# Patient Record
Sex: Female | Born: 1970 | Race: White | Hispanic: No | Marital: Married | Smoking: Never smoker
Health system: Southern US, Community
[De-identification: ages and names within clinical notes are randomized; demographics above are authoritative.]

## PROBLEM LIST (undated history)

## (undated) DIAGNOSIS — I10 Essential (primary) hypertension: Secondary | ICD-10-CM

## (undated) DIAGNOSIS — F988 Other specified behavioral and emotional disorders with onset usually occurring in childhood and adolescence: Secondary | ICD-10-CM

## (undated) DIAGNOSIS — E669 Obesity, unspecified: Secondary | ICD-10-CM

## (undated) DIAGNOSIS — K219 Gastro-esophageal reflux disease without esophagitis: Secondary | ICD-10-CM

## (undated) HISTORY — DX: Gastro-esophageal reflux disease without esophagitis: K21.9

## (undated) HISTORY — DX: Essential (primary) hypertension: I10

## (undated) HISTORY — DX: Other specified behavioral and emotional disorders with onset usually occurring in childhood and adolescence: F98.8

## (undated) HISTORY — DX: Obesity, unspecified: E66.9

---

## 1997-02-13 HISTORY — PX: OTHER SURGICAL HISTORY: SHX169

## 1997-06-17 ENCOUNTER — Ambulatory Visit (HOSPITAL_COMMUNITY): Admission: RE | Admit: 1997-06-17 | Discharge: 1997-06-17 | Payer: Self-pay | Admitting: Surgery

## 1997-06-29 ENCOUNTER — Ambulatory Visit (HOSPITAL_COMMUNITY): Admission: RE | Admit: 1997-06-29 | Discharge: 1997-06-30 | Payer: Self-pay | Admitting: Surgery

## 1997-07-15 ENCOUNTER — Ambulatory Visit (HOSPITAL_COMMUNITY): Admission: RE | Admit: 1997-07-15 | Discharge: 1997-07-15 | Payer: Self-pay | Admitting: Surgery

## 1998-03-01 ENCOUNTER — Other Ambulatory Visit: Admission: RE | Admit: 1998-03-01 | Discharge: 1998-03-01 | Payer: Self-pay | Admitting: Obstetrics and Gynecology

## 1999-03-22 ENCOUNTER — Other Ambulatory Visit: Admission: RE | Admit: 1999-03-22 | Discharge: 1999-03-22 | Payer: Self-pay | Admitting: Obstetrics and Gynecology

## 1999-04-14 ENCOUNTER — Encounter: Payer: Self-pay | Admitting: Internal Medicine

## 1999-04-14 ENCOUNTER — Encounter: Admission: RE | Admit: 1999-04-14 | Discharge: 1999-04-14 | Payer: Self-pay | Admitting: Internal Medicine

## 2000-05-09 ENCOUNTER — Other Ambulatory Visit: Admission: RE | Admit: 2000-05-09 | Discharge: 2000-05-09 | Payer: Self-pay | Admitting: Obstetrics and Gynecology

## 2001-05-13 ENCOUNTER — Other Ambulatory Visit: Admission: RE | Admit: 2001-05-13 | Discharge: 2001-05-13 | Payer: Self-pay | Admitting: Obstetrics and Gynecology

## 2001-05-28 ENCOUNTER — Ambulatory Visit (HOSPITAL_COMMUNITY): Admission: RE | Admit: 2001-05-28 | Discharge: 2001-05-28 | Payer: Self-pay | Admitting: Gastroenterology

## 2002-06-02 ENCOUNTER — Other Ambulatory Visit: Admission: RE | Admit: 2002-06-02 | Discharge: 2002-06-02 | Payer: Self-pay | Admitting: Obstetrics and Gynecology

## 2002-07-21 ENCOUNTER — Encounter: Payer: Self-pay | Admitting: Internal Medicine

## 2002-07-21 ENCOUNTER — Encounter: Admission: RE | Admit: 2002-07-21 | Discharge: 2002-07-21 | Payer: Self-pay | Admitting: Internal Medicine

## 2002-07-23 ENCOUNTER — Encounter: Admission: RE | Admit: 2002-07-23 | Discharge: 2002-07-23 | Payer: Self-pay | Admitting: Internal Medicine

## 2002-07-23 ENCOUNTER — Encounter: Payer: Self-pay | Admitting: Internal Medicine

## 2002-11-03 ENCOUNTER — Other Ambulatory Visit: Admission: RE | Admit: 2002-11-03 | Discharge: 2002-11-03 | Payer: Self-pay | Admitting: Obstetrics and Gynecology

## 2003-05-13 ENCOUNTER — Inpatient Hospital Stay (HOSPITAL_COMMUNITY): Admission: AD | Admit: 2003-05-13 | Discharge: 2003-05-15 | Payer: Self-pay | Admitting: Obstetrics and Gynecology

## 2003-05-13 ENCOUNTER — Observation Stay (HOSPITAL_COMMUNITY): Admission: AD | Admit: 2003-05-13 | Discharge: 2003-05-13 | Payer: Self-pay | Admitting: Obstetrics and Gynecology

## 2003-06-23 ENCOUNTER — Other Ambulatory Visit: Admission: RE | Admit: 2003-06-23 | Discharge: 2003-06-23 | Payer: Self-pay | Admitting: Obstetrics and Gynecology

## 2004-08-06 ENCOUNTER — Emergency Department (HOSPITAL_COMMUNITY): Admission: EM | Admit: 2004-08-06 | Discharge: 2004-08-06 | Payer: Self-pay | Admitting: Emergency Medicine

## 2005-02-28 ENCOUNTER — Encounter: Admission: RE | Admit: 2005-02-28 | Discharge: 2005-05-29 | Payer: Self-pay | Admitting: Obstetrics and Gynecology

## 2005-08-20 ENCOUNTER — Inpatient Hospital Stay (HOSPITAL_COMMUNITY): Admission: AD | Admit: 2005-08-20 | Discharge: 2005-08-20 | Payer: Self-pay | Admitting: Obstetrics and Gynecology

## 2005-08-25 ENCOUNTER — Ambulatory Visit: Payer: Self-pay | Admitting: Obstetrics & Gynecology

## 2005-08-29 ENCOUNTER — Ambulatory Visit: Payer: Self-pay | Admitting: Obstetrics and Gynecology

## 2005-09-01 ENCOUNTER — Ambulatory Visit: Payer: Self-pay | Admitting: Obstetrics & Gynecology

## 2005-09-05 ENCOUNTER — Ambulatory Visit: Payer: Self-pay | Admitting: Obstetrics and Gynecology

## 2005-09-08 ENCOUNTER — Ambulatory Visit: Payer: Self-pay | Admitting: Obstetrics & Gynecology

## 2005-09-12 ENCOUNTER — Ambulatory Visit: Payer: Self-pay | Admitting: Obstetrics and Gynecology

## 2005-09-14 ENCOUNTER — Inpatient Hospital Stay (HOSPITAL_COMMUNITY): Admission: AD | Admit: 2005-09-14 | Discharge: 2005-09-14 | Payer: Self-pay | Admitting: Obstetrics and Gynecology

## 2005-09-19 ENCOUNTER — Ambulatory Visit: Payer: Self-pay | Admitting: Obstetrics and Gynecology

## 2005-09-22 ENCOUNTER — Ambulatory Visit: Payer: Self-pay | Admitting: Obstetrics & Gynecology

## 2005-09-25 ENCOUNTER — Inpatient Hospital Stay (HOSPITAL_COMMUNITY): Admission: AD | Admit: 2005-09-25 | Discharge: 2005-09-29 | Payer: Self-pay | Admitting: Obstetrics & Gynecology

## 2006-01-14 IMAGING — CR DG LUMBAR SPINE COMPLETE 4+V
5 series · 5 of 5 positions shown · non-contrast
Comparison: none

CLINICAL DATA: Low back pain.
LUMBAR SPINE - 4 VIEW:
There is marked narrowing of the L4-5 disc with anterior osteophyte formation.   Moderate narrowing at L3-4 is present.   There is no vertebral body height loss to suggest compression fracture.   No pars defects are seen.   Degenerative changes are also noted in the lower thoracic discs.  Little, if any, degenerative SI joint changes are noted.

[t l-spine a.p. *]
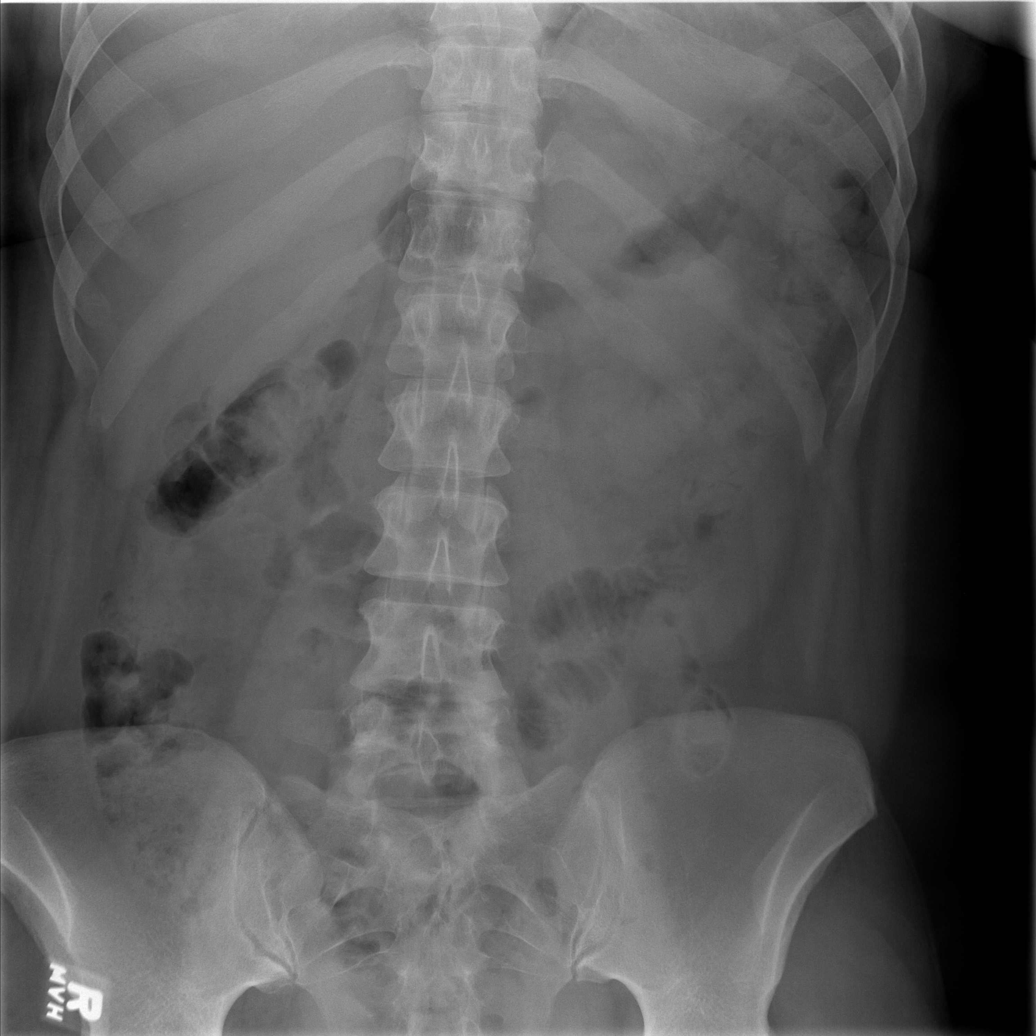

[t l-spine oblique exposure *]
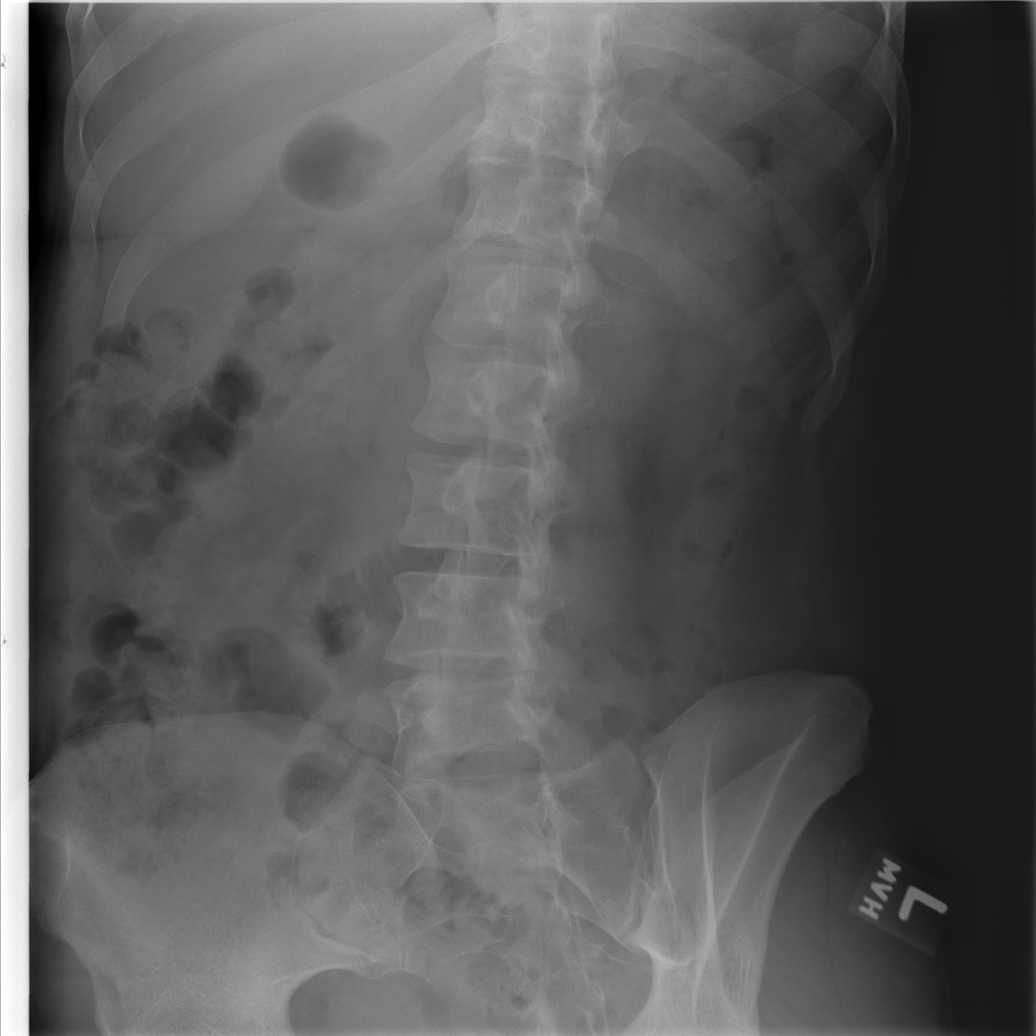

[t l-spine oblique exposure]
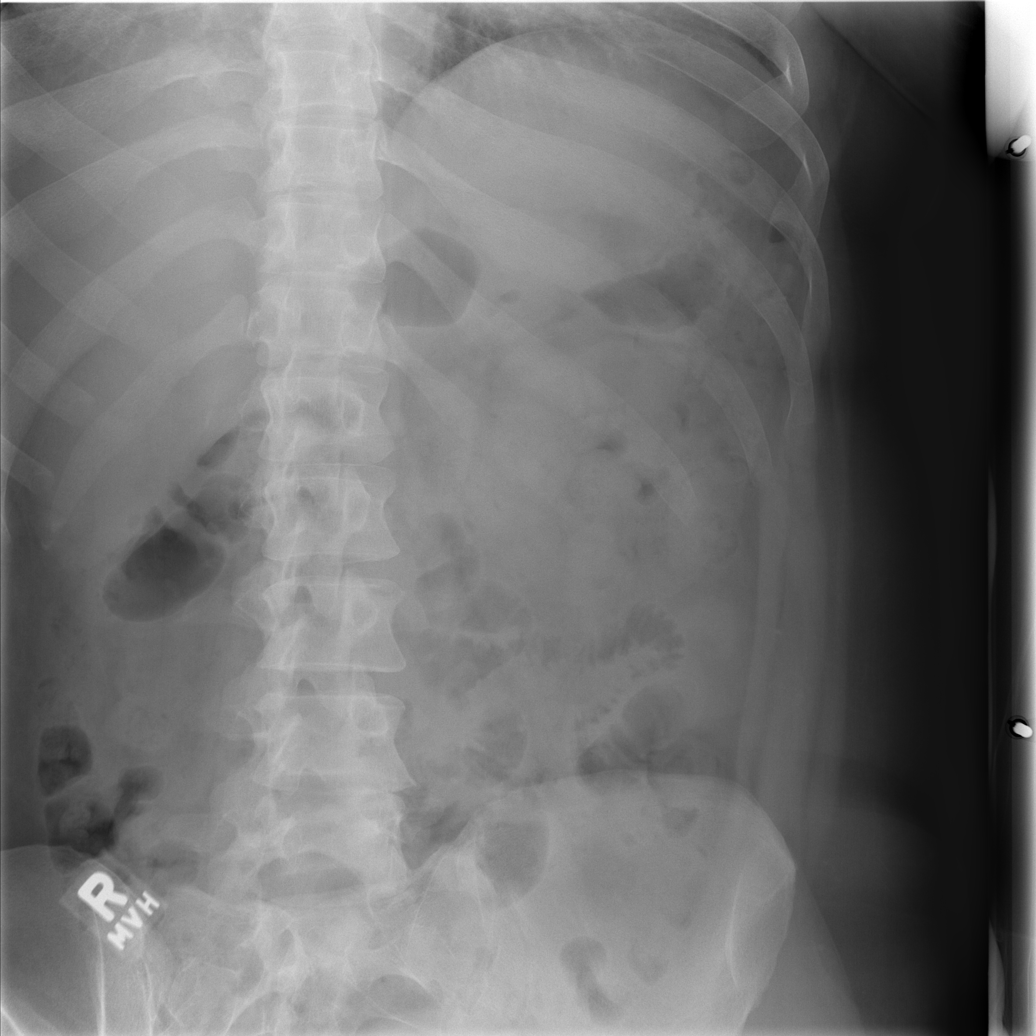

[t l-spine l5-s1 spot * (1 of 2)]
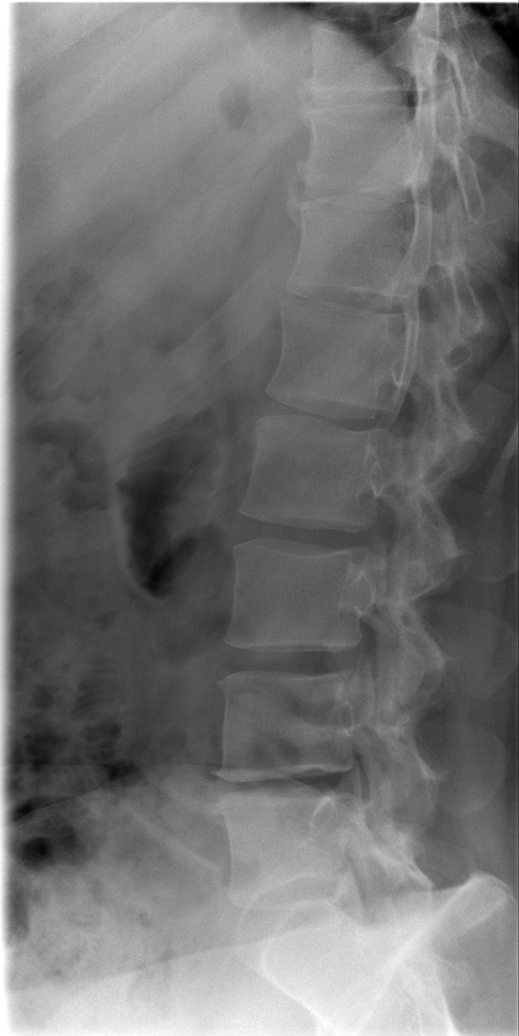

[t l-spine l5-s1 spot * (2 of 2)]
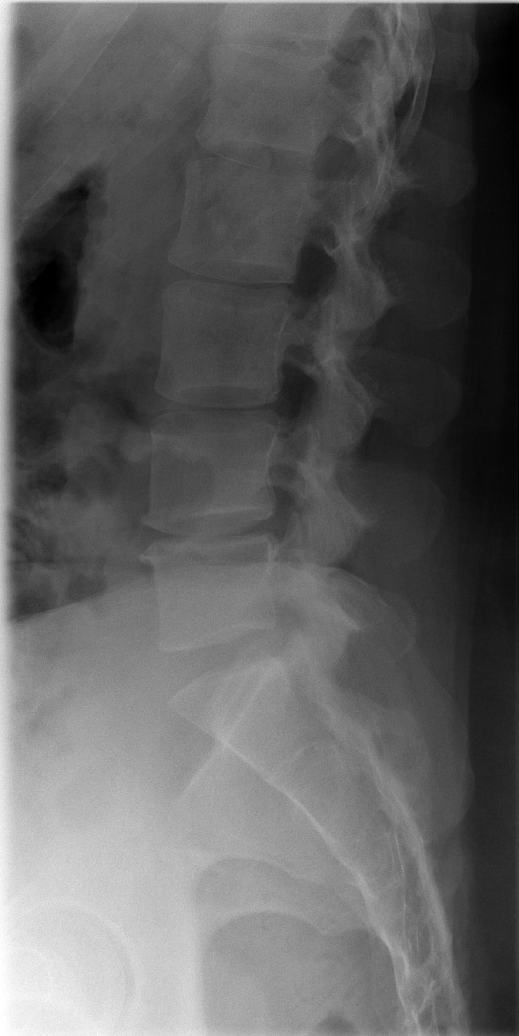

[5 of 5 positions shown; findings below may reference images not displayed]

IMPRESSION: Degenerative changes are most significant at the L4-5 disc.

## 2008-04-29 LAB — HM PAP SMEAR: HM Pap smear: 0

## 2008-11-25 LAB — HM MAMMOGRAPHY: HM Mammogram: 0

## 2012-04-29 DIAGNOSIS — Q752 Hypertelorism: Secondary | ICD-10-CM | POA: Insufficient documentation

## 2012-05-09 ENCOUNTER — Ambulatory Visit (INDEPENDENT_AMBULATORY_CARE_PROVIDER_SITE_OTHER): Payer: 59 | Admitting: Family Medicine

## 2012-05-09 ENCOUNTER — Encounter: Payer: Self-pay | Admitting: Family Medicine

## 2012-05-09 VITALS — BP 136/83 | HR 73 | Wt 259.0 lb

## 2012-05-09 DIAGNOSIS — M549 Dorsalgia, unspecified: Secondary | ICD-10-CM

## 2012-05-09 DIAGNOSIS — J309 Allergic rhinitis, unspecified: Secondary | ICD-10-CM

## 2012-05-09 DIAGNOSIS — F988 Other specified behavioral and emotional disorders with onset usually occurring in childhood and adolescence: Secondary | ICD-10-CM

## 2012-05-09 MED ORDER — FLUOXETINE HCL 40 MG PO CAPS: 40.0000 mg | ORAL_CAPSULE | Freq: Every day | ORAL | Status: DC

## 2012-05-09 MED ORDER — OLOPATADINE HCL 0.2 % OP SOLN: 1.0000 [drp] | Freq: Every day | OPHTHALMIC | Status: DC

## 2012-05-09 MED ORDER — METHOCARBAMOL 750 MG PO TABS: 750.0000 mg | ORAL_TABLET | Freq: Three times a day (TID) | ORAL | Status: DC

## 2012-05-09 MED ORDER — LISDEXAMFETAMINE DIMESYLATE 70 MG PO CAPS: 70.0000 mg | ORAL_CAPSULE | ORAL | Status: DC

## 2012-05-09 MED ORDER — AZELASTINE-FLUTICASONE 137-50 MCG/ACT NA SUSP: 1.0000 | Freq: Two times a day (BID) | NASAL | Status: DC

## 2012-05-09 MED ORDER — MONTELUKAST SODIUM 10 MG PO TABS: 10.0000 mg | ORAL_TABLET | Freq: Every day | ORAL | Status: DC

## 2012-05-09 MED ORDER — CETIRIZINE-PSEUDOEPHEDRINE ER 5-120 MG PO TB12: 1.0000 | ORAL_TABLET | Freq: Two times a day (BID) | ORAL | Status: DC

## 2012-05-09 NOTE — Progress Notes (Signed)
Subjective:     Patient ID: Martha Graham, female   DOB: 1970/08/08, 42 y.o.   MRN: [id number]  HPI Azaiah is here today to get her Vyvanse refilled.  She is doing well on her current dosage.  She also wants to discuss her back pain.  She has been taking a muscle relaxer and has been applying Voltaren Gel.  She is also getting ready for allergy season.  She would like to have some mediations for her symptoms.    Review of Systems  HENT: Positive for rhinorrhea, sneezing and postnasal drip.   Musculoskeletal: Positive for back pain.       Objective:   Physical Exam  Constitutional: She appears well-nourished. No distress.  Neck: No thyromegaly present.  Cardiovascular: Normal rate, regular rhythm and normal heart sounds.   No murmur heard. Pulmonary/Chest: Effort normal and breath sounds normal.  Musculoskeletal: She exhibits no edema.  Psychiatric: She has a normal mood and affect. Her behavior is normal. Judgment and thought content normal.       Assessment:     ADD Obesity Allergic Rhinitis  Back Pain     Plan:     Refilled her Vyvanse  She is planning to attend a seminar on gastric bypass  She was given prescriptions for several medications to control allergy symptoms   Refilled her Robaxin.

## 2012-05-09 NOTE — Patient Instructions (Addendum)
Allergies, Generic  Allergies may happen from anything your body is sensitive to. This may be food, medicines, pollens, chemicals, and nearly anything around you in everyday life that produces allergens. An allergen is anything that causes an allergy producing substance. Heredity is often a factor in causing these problems. This means you may have some of the same allergies as your parents.  Food allergies happen in all age groups. Food allergies are some of the most severe and life threatening. Some common food allergies are cow's milk, seafood, eggs, nuts, wheat, and soybeans.  SYMPTOMS    Swelling around the mouth.   An itchy red rash or hives.   Vomiting or diarrhea.   Difficulty breathing.  SEVERE ALLERGIC REACTIONS ARE LIFE-THREATENING.  This reaction is called anaphylaxis. It can cause the mouth and throat to swell and cause difficulty with breathing and swallowing. In severe reactions only a trace amount of food (for example, peanut oil in a salad) may cause death within seconds.  Seasonal allergies occur in all age groups. These are seasonal because they usually occur during the same season every year. They may be a reaction to molds, grass pollens, or tree pollens. Other causes of problems are house dust mite allergens, pet dander, and mold spores. The symptoms often consist of nasal congestion, a runny itchy nose associated with sneezing, and tearing itchy eyes. There is often an associated itching of the mouth and ears. The problems happen when you come in contact with pollens and other allergens. Allergens are the particles in the air that the body reacts to with an allergic reaction. This causes you to release allergic antibodies. Through a chain of events, these eventually cause you to release histamine into the blood stream. Although it is meant to be protective to the body, it is this release that causes your discomfort. This is why you were given anti-histamines to feel better. If you are  unable to pinpoint the offending allergen, it may be determined by skin or blood testing. Allergies cannot be cured but can be controlled with medicine.  Hay fever is a collection of all or some of the seasonal allergy problems. It may often be treated with simple over-the-counter medicine such as diphenhydramine. Take medicine as directed. Do not drink alcohol or drive while taking this medicine. Check with your caregiver or package insert for child dosages.  If these medicines are not effective, there are many new medicines your caregiver can prescribe. Stronger medicine such as nasal spray, eye drops, and corticosteroids may be used if the first things you try do not work well. Other treatments such as immunotherapy or desensitizing injections can be used if all else fails. Follow up with your caregiver if problems continue. These seasonal allergies are usually not life threatening. They are generally more of a nuisance that can often be handled using medicine.  HOME CARE INSTRUCTIONS    If unsure what causes a reaction, keep a diary of foods eaten and symptoms that follow. Avoid foods that cause reactions.   If hives or rash are present:   Take medicine as directed.   You may use an over-the-counter antihistamine (diphenhydramine) for hives and itching as needed.   Apply cold compresses (cloths) to the skin or take baths in cool water. Avoid hot baths or showers. Heat will make a rash and itching worse.   If you are severely allergic:   Following a treatment for a severe reaction, hospitalization is often required for closer follow-up.     Wear a medic-alert bracelet or necklace stating the allergy.   You and your family must learn how to give adrenaline or use an anaphylaxis kit.   If you have had a severe reaction, always carry your anaphylaxis kit or EpiPen with you. Use this medicine as directed by your caregiver if a severe reaction is occurring. Failure to do so could have a fatal outcome.  SEEK  MEDICAL CARE IF:   You suspect a food allergy. Symptoms generally happen within 30 minutes of eating a food.   Your symptoms have not gone away within 2 days or are getting worse.   You develop new symptoms.   You want to retest yourself or your child with a food or drink you think causes an allergic reaction. Never do this if an anaphylactic reaction to that food or drink has happened before. Only do this under the care of a caregiver.  SEEK IMMEDIATE MEDICAL CARE IF:    You have difficulty breathing, are wheezing, or have a tight feeling in your chest or throat.   You have a swollen mouth, or you have hives, swelling, or itching all over your body.   You have had a severe reaction that has responded to your anaphylaxis kit or an EpiPen. These reactions may return when the medicine has worn off. These reactions should be considered life threatening.  MAKE SURE YOU:    Understand these instructions.   Will watch your condition.   Will get help right away if you are not doing well or get worse.  Document Released: 04/25/2002 Document Revised: 04/24/2011 Document Reviewed: 09/30/2007  [employer] Patient Information 2013 [employer].

## 2012-05-12 ENCOUNTER — Encounter: Payer: Self-pay | Admitting: Family Medicine

## 2012-05-12 DIAGNOSIS — J309 Allergic rhinitis, unspecified: Secondary | ICD-10-CM | POA: Insufficient documentation

## 2012-05-12 DIAGNOSIS — F988 Other specified behavioral and emotional disorders with onset usually occurring in childhood and adolescence: Secondary | ICD-10-CM | POA: Insufficient documentation

## 2012-05-12 DIAGNOSIS — M549 Dorsalgia, unspecified: Secondary | ICD-10-CM | POA: Insufficient documentation

## 2012-06-03 ENCOUNTER — Encounter: Payer: Self-pay | Admitting: *Deleted

## 2012-07-31 ENCOUNTER — Ambulatory Visit (INDEPENDENT_AMBULATORY_CARE_PROVIDER_SITE_OTHER): Payer: 59 | Admitting: Family Medicine

## 2012-07-31 ENCOUNTER — Encounter: Payer: Self-pay | Admitting: Family Medicine

## 2012-07-31 VITALS — BP 129/85 | HR 74 | Ht 68.0 in | Wt 255.0 lb

## 2012-07-31 DIAGNOSIS — M542 Cervicalgia: Secondary | ICD-10-CM | POA: Insufficient documentation

## 2012-07-31 DIAGNOSIS — F988 Other specified behavioral and emotional disorders with onset usually occurring in childhood and adolescence: Secondary | ICD-10-CM

## 2012-07-31 DIAGNOSIS — H9203 Otalgia, bilateral: Secondary | ICD-10-CM

## 2012-07-31 DIAGNOSIS — J069 Acute upper respiratory infection, unspecified: Secondary | ICD-10-CM | POA: Insufficient documentation

## 2012-07-31 DIAGNOSIS — H9209 Otalgia, unspecified ear: Secondary | ICD-10-CM | POA: Insufficient documentation

## 2012-07-31 MED ORDER — LISDEXAMFETAMINE DIMESYLATE 70 MG PO CAPS: 70.0000 mg | ORAL_CAPSULE | ORAL | Status: DC

## 2012-07-31 MED ORDER — METHYLPREDNISOLONE SODIUM SUCC 125 MG IJ SOLR
125.0000 mg | Freq: Once | INTRAMUSCULAR | Status: AC
  Administered 2012-07-31: 125 mg via INTRAMUSCULAR

## 2012-07-31 MED ORDER — KETOROLAC TROMETHAMINE 60 MG/2ML IM SOLN
60.0000 mg | Freq: Once | INTRAMUSCULAR | Status: AC
  Administered 2012-07-31: 60 mg via INTRAMUSCULAR

## 2012-07-31 NOTE — Patient Instructions (Addendum)
1)  Neck Pain - You received injections of Solu-Medrol and Toradol at today's visit.  You might consider getting a chiropractic adjustment.    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       Temporomandibular Problems  Temporomandibular joint (TMJ) dysfunction means there are problems with the joint between your jaw and your skull. This is a joint lined by cartilage like other joints in your body but also has a small disc in the joint which keeps the bones from rubbing on each other. These joints are like other joints and can get inflamed (sore) from arthritis and other problems. When this joint gets sore, it can cause headaches and pain in the jaw and the face. CAUSES  Usually the arthritic types of problems are caused by soreness in the joint. Soreness in the joint can also be caused by overuse. This may come from grinding your teeth. It may also come from mis-alignment in the joint. DIAGNOSIS Diagnosis of this condition can often be made by history and exam. Sometimes your caregiver may need X-rays or an MRI scan to determine the exact cause. It may be necessary to see your dentist to determine if your teeth and jaws are lined up correctly. TREATMENT  Most of the time this problem is not serious; however, sometimes it can persist (become chronic). When this happens medications that will cut down on inflammation (soreness) help. Sometimes a shot of cortisone into the joint will be helpful. If your teeth are not aligned it may help for your dentist to make a splint for your mouth that can help this problem. If no physical problems can be found, the problem may come from tension. If tension is found to be the cause, biofeedback or relaxation techniques may be helpful. HOME CARE INSTRUCTIONS   Later in the day, applications of ice packs may be helpful. Ice can be used in a plastic bag with a towel around it to prevent frostbite to skin. This may be used about every 2 hours for 20 to 30  minutes, as needed while awake, or as directed by your caregiver.  Only take over-the-counter or prescription medicines for pain, discomfort, or fever as directed by your caregiver.  If physical therapy was prescribed, follow your caregiver's directions.  Wear mouth appliances as directed if they were given. Document Released: 10/25/2000 Document Revised: 04/24/2011 Document Reviewed: 02/02/2008 [facility] Patient Information 2014 [place], [place].

## 2012-07-31 NOTE — Assessment & Plan Note (Signed)
Refilled her Vyvanse 

## 2012-07-31 NOTE — Progress Notes (Signed)
  Subjective:    Patient ID: Martha Graham, female    DOB: 06-08-1970, 42 y.o.   MRN: [id number]  HPI  Martha Graham is here today to discuss a few issues:   1)  ADD:  She is here today to get a refill on her Vyvanse. She is doing fine on her current dosage.  She is eating and sleeping fine.   2) Neck/Head Pain:  She has been having some pain in her neck that radiates up to her head on the right side.  She has tried taking OTC pain analgesics which have not given her much relief.   3)  Nasal Congestion:  She has had nasal congestion for over a week. She went to the [facility] on Saturday and was told that she had an infected right ear.  She was put on Amoxicillin for 10 days.  She really does not feel that the antibiotic has helped her very much.    Review of Systems  HENT: Positive for ear pain, congestion and rhinorrhea.   Musculoskeletal: Positive for arthralgias (Neck Pain).       Neck Pain   Psychiatric/Behavioral: Negative for sleep disturbance. The patient is not nervous/anxious.      Past Medical History  Diagnosis Date  . Hypertension   . Obesity   . Attention deficit disorder without mention of hyperactivity   . GERD (gastroesophageal reflux disease)      Family History  Problem Relation Age of Onset  . Diabetes type II Maternal Grandfather   . CVA Maternal Grandfather   . Alzheimer's disease Paternal Grandmother   . Breast cancer Paternal Grandmother    History   Social History Narrative   Cardiovascular Disease:  None   CVA:  Maternal Grandfather   Type II Diabetes:  Maternal Grandfather   Hypertension:  None   Hyperlipidemia:  None   Alzheimer's Disease:  Paternal Grandmother   Breast Cancer: Paternal Grandmother   Ovarian Cancer:  None   Colon Cancer:  None   Depression:  None   Asthma:  None   Thyroid Disease:  None         Objective:   Physical Exam  Constitutional: She appears well-nourished. No distress.  HENT:  Head: Normocephalic.   Mouth/Throat: No oropharyngeal exudate.  Eyes: Conjunctivae are normal. Right eye exhibits no discharge. Left eye exhibits no discharge. No scleral icterus.  Neck: Neck supple. No thyromegaly present.  Cardiovascular: Normal rate, regular rhythm and normal heart sounds.  Exam reveals no gallop and no friction rub.   No murmur heard. Pulmonary/Chest: Effort normal and breath sounds normal. She has no wheezes. She exhibits no tenderness.  Abdominal: There is no tenderness.  Musculoskeletal: Normal range of motion. She exhibits tenderness (Her neck muscles are very tight ). She exhibits no edema.  Lymphadenopathy:    She has no cervical adenopathy.  Neurological: She is alert.  Skin: Skin is warm and dry. No rash noted.  Psychiatric: She has a normal mood and affect. Her behavior is normal. Judgment and thought content normal.          Assessment & Plan:

## 2012-07-31 NOTE — Assessment & Plan Note (Signed)
She has been taking Amoxil since Saturday.  Her right ear looks fine now.  She will finish the full course of the antibiotic.  We discussed how her ear pain may be related to TMJ. She is going to apply some Voltaren Gel around the right ear and will take the Robaxin she has at home.

## 2012-07-31 NOTE — Assessment & Plan Note (Signed)
She was given injections of Solu-Medrol and Toradol.  We also discussed her seeing a chiropractor for an adjustment.

## 2012-08-09 ENCOUNTER — Ambulatory Visit: Payer: 59 | Admitting: Family Medicine

## 2012-10-22 ENCOUNTER — Ambulatory Visit: Payer: 59 | Admitting: Family Medicine

## 2012-12-25 ENCOUNTER — Ambulatory Visit (INDEPENDENT_AMBULATORY_CARE_PROVIDER_SITE_OTHER): Payer: 59 | Admitting: Family Medicine

## 2012-12-25 ENCOUNTER — Encounter: Payer: Self-pay | Admitting: Family Medicine

## 2012-12-25 VITALS — BP 154/105 | HR 81 | Resp 16 | Ht 68.5 in | Wt 258.0 lb

## 2012-12-25 DIAGNOSIS — R41840 Attention and concentration deficit: Secondary | ICD-10-CM

## 2012-12-25 DIAGNOSIS — I10 Essential (primary) hypertension: Secondary | ICD-10-CM

## 2012-12-25 DIAGNOSIS — K219 Gastro-esophageal reflux disease without esophagitis: Secondary | ICD-10-CM

## 2012-12-25 DIAGNOSIS — F411 Generalized anxiety disorder: Secondary | ICD-10-CM

## 2012-12-25 DIAGNOSIS — J309 Allergic rhinitis, unspecified: Secondary | ICD-10-CM

## 2012-12-25 MED ORDER — TRIAMTERENE-HCTZ 37.5-25 MG PO TABS: 1.0000 | ORAL_TABLET | Freq: Every day | ORAL | Status: AC

## 2012-12-25 MED ORDER — PANTOPRAZOLE SODIUM 40 MG PO TBEC: 40.0000 mg | DELAYED_RELEASE_TABLET | Freq: Every day | ORAL | Status: AC

## 2012-12-25 MED ORDER — NEBIVOLOL HCL 10 MG PO TABS: 10.0000 mg | ORAL_TABLET | Freq: Every day | ORAL | Status: AC

## 2012-12-25 MED ORDER — LISDEXAMFETAMINE DIMESYLATE 70 MG PO CAPS: 70.0000 mg | ORAL_CAPSULE | Freq: Every day | ORAL | Status: DC

## 2012-12-25 MED ORDER — FLUOXETINE HCL 40 MG PO CAPS: 40.0000 mg | ORAL_CAPSULE | Freq: Every day | ORAL | Status: DC

## 2012-12-25 NOTE — Progress Notes (Signed)
Subjective:    Patient ID: Martha Graham, female    DOB: 1970/03/24, 42 y.o.   MRN: [id number]  HPI  Martha Graham is here today to discuss the conditions listed below:   1)  ADD - She needs a refill of her ADD medication (Vyvanse 70 mg).  She says that the medication continues to help her concentrate at work.  She feels that this dosage is appropriate and would like to continue on it.    2)  Obesity - She continues to struggle with her weight.  She has been thinking about bariatric surgery vs appetite suppressants.    3)  Hypertension - She was taking Bystolic (5 mg) 1/2 of 10 mg.  She has run out of it and has been taking only the Triamterene/HCTZ.  4)  GERD - She needs a refill on her pantoprazole (40 mg).    Review of Systems  Constitutional: Negative.   HENT: Negative.   Eyes: Negative.   Respiratory: Negative.   Cardiovascular: Negative.   Gastrointestinal: Negative.   Endocrine: Negative.   Genitourinary: Negative.   Musculoskeletal: Negative.   Skin: Negative.   Allergic/Immunologic: Negative.   Neurological: Negative.   Hematological: Negative.   Psychiatric/Behavioral: Negative.      Past Medical History  Diagnosis Date  . Hypertension   . Obesity   . Attention deficit disorder without mention of hyperactivity   . GERD (gastroesophageal reflux disease)      Past Surgical History  Procedure Laterality Date  . Cesarean section    . 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 Diabetes type II Maternal Grandfather   . CVA  Maternal Grandfather   . Alzheimer's disease Paternal Grandmother   . Breast cancer Paternal Grandmother      Current Outpatient Prescriptions on File Prior to Visit  Medication Sig Dispense Refill  . cetirizine-pseudoephedrine (ZYRTEC-D) 5-120 MG per tablet Take 1 tablet by mouth 2 (two) times daily.  60 tablet  11  . lisdexamfetamine (VYVANSE) 70 MG capsule Take 1 capsule (70 mg total) by mouth every morning.  90 capsule  0  . methocarbamol (ROBAXIN) 750 MG tablet Take 1 tablet (750 mg total) by mouth 3 (three) times daily.  90 tablet  3  . montelukast (SINGULAIR) 10 MG tablet Take 1 tablet (10 mg total) by mouth at bedtime.  30 tablet  11   No current facility-administered medications on file prior to visit.     No Known Allergies   Immunization History  Administered Date(s) Administered  . Tdap 01/13/2006      Objective:   Physical Exam  Vitals reviewed. Constitutional: She is oriented to person, place, and time. She appears well-developed and well-nourished.  Cardiovascular: Normal rate and regular rhythm.   Pulmonary/Chest: Effort normal and breath sounds normal.  Neurological: She is alert and oriented to person, place, and time.  Skin: Skin is  warm and dry.  Psychiatric: She has a normal mood and affect.      Assessment & Plan:    Martha Graham was seen today for medication management.  Diagnoses and associated orders for this visit:  GERD (gastroesophageal reflux disease) - pantoprazole (PROTONIX) 40 MG tablet; Take 1 tablet (40 mg total) by mouth daily.  Essential hypertension, benign - nebivolol (BYSTOLIC) 10 MG tablet; Take 1 tablet (10 mg total) by mouth daily. - triamterene-hydrochlorothiazide (MAXZIDE-25) 37.5-25 MG per tablet; Take 1 tablet by mouth daily.  Anxiety state, unspecified - FLUoxetine (PROZAC) 40 MG capsule; Take 1 capsule (40 mg total) by mouth daily.  Concentration deficit - lisdexamfetamine (VYVANSE) 70 MG capsule; Take 1 capsule (70 mg  total) by mouth daily.  TIME SPENT "FACE TO FACE" WITH PATIENT -  30 MINS

## 2012-12-25 NOTE — Patient Instructions (Signed)
1)  BP - Take your Bystolic and Maxzide to get your pressure down  2)  GERD - Try adding the GI cocktail as needed for GERD  3)  Weight - Bariatric surgery vs Qsymia    Bariatric Surgery Information Severe obesity is difficult to treat through diet and exercise alone. Bariatric surgery (also called weight loss surgery) is an option for people who are severely obese and cannot lose weight by traditional means, or who suffer from serious obesity-related health problems. The surgery promotes weight loss by decreasing the absorption of food and, in some cases, by interrupting the digestive process. As in other treatments for obesity, best results are achieved with healthy eating behaviors and regular physical activity.  People who may consider bariatric surgery include those with a body mass index (BMI) above 40. Men with a BMI of 40 are about 100 lb (45 kg) overweight and women with this BMI are about 80 lb (36 kg) overweight. People with a BMI between 35 and 40 and who suffer from type 2 diabetes or life-threatening heart and lung (cardiopulmonary) problems, such as severe sleep apnea or obesity-related heart disease, may also be candidates for surgery.  THE NORMAL DIGESTIVE PROCESS Normally, as food moves along the digestive tract, digestive juices and enzymes digest and absorb calories and nutrients. After we chew and swallow our food, it moves down the esophagus to the stomach. There a strong acid continues the digestive process. When the stomach contents move to the first portion of the small intestine (duodenum), bile and pancreatic juice speed up digestion. The jejunum and ileum are the remaining two segments of the small intestine. They complete the absorption of almost all calories and nutrients. The food particles that cannot be digested in the small intestine are stored in the large intestine until they are eliminated.  HOW DOES SURGERY PROMOTE WEIGHT LOSS? Bariatric surgery alters the digestive  process. The surgery closes off parts of the stomach to make it smaller, restricting the amount of food the stomach can hold. There are two types of bariatric surgeries: restrictive surgeries and malabsorptive surgeries. Restrictive surgeries only reduce stomach size. They do not interfere with the normal digestive process. Malabsorptive surgeries combine stomach restriction with a partial bypass of the small intestine. These types of procedures create a direct connection from the stomach to the lower segment of the small intestine. The connection causes food to bypass the portions of the digestive tract that absorb calories and nutrients.Malabsorptive surgeries are the most common surgeries for weight loss. They restrict both food intake and the amount of calories and nutrients the body absorbs.  Restrictive surgeries lead to weight loss in almost all patients. But they are less successful than malabsorptive surgeries in achieving substantial, long-term weight loss. Some patients regain weight. Others are unable to adjust their eating habits and fail to lose the desired weight. Successful results depend on the patient's willingness to adopt a long-term plan of healthy eating and regular physical activity.  RESTRICTIVE SURGERY To perform a restrictive surgery, health care providers create a small pouch at the top of the stomach where food enters from the esophagus. At first, the pouch holds about 1 oz (28 g) of food. It later expands to hold 2 3 oz (56 84 g). The lower outlet of the pouch usually has a diameter of about  in (1.9 cm). This small outlet delays the emptying of food from the pouch and causes a feeling of fullness. As a result of this surgery,  most people lose the ability to eat large amounts of food at one time. After the surgery, the person usually can eat only  to 1 c (about 2 L) of food without discomfort or nausea. Also, food has to be well chewed.  There are several types of procedures that  create this pouch. Adjustable Gastric Banding In this procedure, a hollow band is placed around the stomach near its upper end. This creates a small pouch and a narrow passage into the larger remainder of the stomach. The band is then inflated with a salt solution. It can be tightened or loosened over time to change the size of the passage by increasing or decreasing the amount of salt solution.  The band is adjusted based on feelings of hunger and weight loss. Patients decide when they need an adjustment and come to their surgeons to be evaluated. The adjustment is done as an office visit. The band is fully reversible with a second surgery if the patient changes his or her mind. There is no cutting or rerouting of the intestine.  Vertical Banded Gastroplasty This is the most common restrictive surgery for weight control. Both a band and staples are used to create a small stomach pouch. Vertical banded gastroplasty is based on the same principle of restriction as adjustable gastric banding, but the stomach is surgically altered with the stapling. This treatment is not reversible.  About 30% of those who undergo vertical banded gastroplasty achieve normal weight. About 80% achieve some degree of weight loss. MALABSORPTIVE SURGERY Malabsorptive surgeries produce more weight loss than restrictive surgeries. And they are more effective in reversing the health problems associated with severe obesity. Patients who have malabsorptive surgeries generally lose two-thirds of their excess weight within 2 years. There are several types of malabsorptive surgeries. Each one carries its own benefits and risks.  Roux-en-Y Gastric Bypass (RGB) This surgery is the most common and successful malabsorptive surgery. First, a small stomach pouch is created to restrict food intake. Next, a y-shaped section of the small intestine is attached to the pouch. This allows food to bypass the lower stomach, the duodenum, and the  first portion of jejunum. This reduces the amount of calories and nutrients the body absorbs.  Biliopancreatic diversion (BPD) In this more complicated malabsorptive surgery, portions of the stomach are removed. The small pouch that remains is connected directly to the final segment of the small intestine, completely bypassing the duodenum and the jejunum.  This procedure successfully promotes weight loss. But it is less frequently used than other types of surgery because of the high risk for nutritional deficiencies. A variation of this procedure includes a "duodenal switch." This leaves a larger portion of the stomach intact, including the valve that regulates the release of stomach contents into the small intestine (pyloric valve). It also keeps a small part of the duodenum in the digestive pathway.  WHAT ARE THE BENEFITS AND RISKS OF BARIATRIC SURGERY? General Benefits  Right after surgery, most patients lose weight quickly. They continue to lose weight for 18 24 months after the procedure. Most patients regain 5 10% of the weight they lost, but many maintain a long-term weight loss of about 100 lb (45 kg).   Most obesity-related conditions, such as abnormal blood sugar levels, improve after the surgery.  General Risks  Infection.  Abdominal hernias.   Breakdown of the staple line.   Stretched stomach outlets.  Development of gallstones. These are clumps of cholesterol and other matter that form  in the gallbladder. During quick or substantial weight loss, one's risk of developing gallstones increases.   Nutritional deficiencies. Nearly 30% of patients who have bariatric surgery develop nutritional deficiencies. These include anemia, osteoporosis, and metabolic bone disease.  Leak from any of the anastomosis. This is life-threatening. The more involved the surgery, the more risk involved.   Inability to lose weight or weight gain. Patients that do not follow a strict diet will  stretch out their stomach pouches and they will not lose weight.   Dumping syndrome. This occurs when stomach contents move too rapidly through the small intestine causing cramping, diarrhea, nausea, palpitations, sweating, bloating, and dizziness or fainting.  Specific Risks of Restrictive Surgeries  Vomiting. This occurs when the small stomach is overly stretched by food particles that have not been chewed well.   Band slippage and saline leakage. This may occur after adjustable gastric banding.   Band erosion into the lumen of the stomach.   Wearing away of the band and breakdown of the staple line. This may occur after vertical banded gastroplasty. In a small number of cases, stomach juices may leak into the abdomen. If this happens, an emergency surgery is needed.   Death from complications (rare). This happens in only about 1% of cases.   Stomach prolapse. Specific Risks of Malabsorptive Surgeries In addition to the risks of restrictive surgeries, malabsorptive surgeries also carry greater risk for nutritional deficiencies. This is because the procedure causes food to bypass the duodenum and jejunum. That is where most iron and calcium are absorbed. The more extensive the bypass, the greater the risk is for complications and nutritional deficiencies, including:    Anemia.  Osteoporosis.   Metabolic bone disease. Follow-up surgeries to correct complications are needed in about 10 20% of patients.  FOR MORE INFORMATION American Society for Metabolic & Bariatric Surgery: www.asmbs.org  Weight-control Information Network (WIN): FindSpin.nl  Document Released: 01/30/2005 Document Revised: 10/02/2012 Document Reviewed: 07/30/2012 [facility] Patient Information 2014 [place], [place].

## 2013-03-07 ENCOUNTER — Encounter: Payer: Self-pay | Admitting: Family Medicine

## 2013-03-18 ENCOUNTER — Ambulatory Visit: Payer: 59 | Admitting: Family Medicine

## 2013-04-28 ENCOUNTER — Ambulatory Visit (INDEPENDENT_AMBULATORY_CARE_PROVIDER_SITE_OTHER): Payer: 59 | Admitting: Family Medicine

## 2013-04-28 ENCOUNTER — Encounter: Payer: Self-pay | Admitting: Family Medicine

## 2013-04-28 VITALS — BP 145/95 | HR 85 | Wt 263.0 lb

## 2013-04-28 DIAGNOSIS — M25519 Pain in unspecified shoulder: Secondary | ICD-10-CM

## 2013-04-28 DIAGNOSIS — R41840 Attention and concentration deficit: Secondary | ICD-10-CM

## 2013-04-28 DIAGNOSIS — A09 Infectious gastroenteritis and colitis, unspecified: Secondary | ICD-10-CM

## 2013-04-28 MED ORDER — LISDEXAMFETAMINE DIMESYLATE 70 MG PO CAPS: 70.0000 mg | ORAL_CAPSULE | ORAL | Status: DC

## 2013-04-28 MED ORDER — DICLOFENAC SODIUM 1 % TD GEL: 4.0000 g | Freq: Four times a day (QID) | TRANSDERMAL | Status: AC

## 2013-04-28 MED ORDER — CIPROFLOXACIN HCL 500 MG PO TABS: 500.0000 mg | ORAL_TABLET | Freq: Two times a day (BID) | ORAL | Status: AC

## 2013-04-28 NOTE — Progress Notes (Signed)
Subjective:    Patient ID: Molly Maduro, female    DOB: 1970/02/18, 43 y.o.   MRN: [id number]  HPI  Kemani is here today to discuss the conditions listed below:   1)  ADD - She need a refill on her ADD medication (Vyvanse 70 mg).  She says that the medication continues to help her concentrate at work.  She feels that this dosage is appropriate and would like to continue on it.    2)  Shoulder Pain - She has been using Voltaren Gel for her shoulder pain.  She is in need of refills.    3)  Traveling - She is going on a cruise to [place] and would like to have a prescription for an antibiotic just in case she develops traveler's diarrhea.       Review of Systems  Constitutional: Positive for appetite change. Negative for fatigue and unexpected weight change.  Cardiovascular: Negative for chest pain and palpitations.  Neurological: Negative for dizziness and headaches.  Psychiatric/Behavioral: Negative for behavioral problems, sleep disturbance, dysphoric mood, decreased concentration and agitation. The patient is not nervous/anxious and is not hyperactive.      Past Medical History  Diagnosis Date  . Hypertension   . Obesity   . Attention deficit disorder without mention of hyperactivity   . GERD (gastroesophageal reflux disease)      Past Surgical History  Procedure Laterality Date  . Cesarean section    . 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 Diabetes type II Maternal Grandfather   . CVA Maternal Grandfather   . Alzheimer's  disease Paternal Grandmother   . Breast cancer Paternal Grandmother   . Pneumonia Mother   . Diabetes type II Father      Current Outpatient Prescriptions on File Prior to Visit  Medication Sig Dispense Refill  . Azelastine-Fluticasone 137-50 MCG/ACT SUSP Place 1 spray into the nose 2 (two) times daily. PRN      . FLUoxetine (PROZAC) 40 MG capsule Take 1 capsule (40 mg total) by mouth daily.  90 capsule  1  . montelukast (SINGULAIR) 10 MG tablet Take 1 tablet (10 mg total) by mouth at bedtime.  30 tablet  11  . nebivolol (BYSTOLIC) 10 MG tablet Take 1 tablet (10 mg total) by mouth daily.  90 tablet  3  . pantoprazole (PROTONIX) 40 MG tablet Take 1 tablet (40 mg total) by mouth daily.  90 tablet  3  . triamterene-hydrochlorothiazide (MAXZIDE-25) 37.5-25 MG per tablet Take 1 tablet by mouth daily.  90 tablet  3   No current facility-administered medications on file prior to visit.     No Known Allergies   Immunization History  Administered Date(s) Administered  . Tdap 01/13/2006       Objective:   Physical Exam  Constitutional: She appears well-nourished. No distress.  Cardiovascular: Normal rate, regular rhythm and  normal heart sounds.   Pulmonary/Chest: Effort normal and breath sounds normal.  Neurological: She is alert.  Psychiatric: She has a normal mood and affect. Her behavior is normal. Judgment and thought content normal.      Assessment & Plan:    Misty StanleyLisa was seen today for medication management.  Diagnoses and associated orders for this visit:  Attention and concentration deficit - lisdexamfetamine (VYVANSE) 70 MG capsule; Take 1 capsule (70 mg total) by mouth every morning.  Refills x 3 months.   Pain in joint, shoulder region - diclofenac sodium (VOLTAREN) 1 % GEL; Apply 4 g topically 4 (four) times daily. To two body areas  Traveler's diarrhea - ciprofloxacin (CIPRO) 500 MG tablet; Take 1 tablet (500 mg total) by mouth 2 (two) times daily.

## 2013-07-14 ENCOUNTER — Encounter: Payer: Self-pay | Admitting: Family Medicine

## 2013-07-14 ENCOUNTER — Ambulatory Visit (INDEPENDENT_AMBULATORY_CARE_PROVIDER_SITE_OTHER): Payer: 59 | Admitting: Family Medicine

## 2013-07-14 VITALS — BP 125/84 | HR 67 | Resp 16 | Ht 68.5 in | Wt 263.0 lb

## 2013-07-14 DIAGNOSIS — E669 Obesity, unspecified: Secondary | ICD-10-CM

## 2013-07-14 DIAGNOSIS — R41840 Attention and concentration deficit: Secondary | ICD-10-CM

## 2013-07-14 DIAGNOSIS — F411 Generalized anxiety disorder: Secondary | ICD-10-CM

## 2013-07-14 MED ORDER — PHENTERMINE-TOPIRAMATE ER 3.75-23 MG PO CP24: 1.0000 | ORAL_CAPSULE | Freq: Every day | ORAL | Status: AC

## 2013-07-14 MED ORDER — PHENTERMINE-TOPIRAMATE ER 7.5-46 MG PO CP24: 1.0000 | ORAL_CAPSULE | Freq: Every day | ORAL | Status: AC

## 2013-07-14 MED ORDER — LISDEXAMFETAMINE DIMESYLATE 70 MG PO CAPS: 70.0000 mg | ORAL_CAPSULE | ORAL | Status: DC

## 2013-07-14 MED ORDER — FLUOXETINE HCL 40 MG PO CAPS: 40.0000 mg | ORAL_CAPSULE | Freq: Every day | ORAL | Status: AC

## 2013-07-14 MED ORDER — LISDEXAMFETAMINE DIMESYLATE 70 MG PO CAPS: 70.0000 mg | ORAL_CAPSULE | ORAL | Status: AC

## 2013-07-14 NOTE — Progress Notes (Signed)
Subjective:    Patient ID: Martha Graham, female    DOB: 12/18/1970, 43 y.o.   MRN: [id number]  HPI  Martha Graham is here today for medication refills:  1)  Mood - Her mood is stable on Prozac.   2)  ADD - She needs a refill for her Vyvanse.    3)  Weight - She would like to try some Qsymia for her weight.       Review of Systems  Constitutional: Negative for activity change, appetite change and fatigue.  Cardiovascular: Negative for chest pain, palpitations and leg swelling.  Psychiatric/Behavioral: Negative for behavioral problems and sleep disturbance. The patient is not nervous/anxious.   All other systems reviewed and are negative.    Past Medical History  Diagnosis Date  . Hypertension   . Obesity   . Attention deficit disorder without mention of hyperactivity   . GERD (gastroesophageal reflux disease)      Past Surgical History  Procedure Laterality Date  . Cesarean section    . Parathyroid surgery  1999     History   Social History Narrative   Marital Status:  Married Marine scientist(James)    Children:  Daughters (3)    Pets:  Dog Secondary school teacher(Martha)   Living Situation: Lives with husband and children.   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 Diabetes type II Maternal Grandfather   . CVA Maternal Grandfather   . Alzheimer's disease Paternal Grandmother   . Breast cancer Paternal Grandmother   . Pneumonia Mother   . Diabetes type II Father      Current Outpatient Prescriptions on File Prior to Visit  Medication Sig Dispense Refill  . diclofenac sodium (VOLTAREN) 1 % GEL Apply 4 g topically 4 (four) times daily. To two body areas  10 Tube  3  . nebivolol (BYSTOLIC) 10 MG tablet Take 1 tablet (10 mg total) by mouth daily.  90 tablet  3  .  pantoprazole (PROTONIX) 40 MG tablet Take 1 tablet (40 mg total) by mouth daily.  90 tablet  3  . triamterene-hydrochlorothiazide (MAXZIDE-25) 37.5-25 MG per tablet Take 1 tablet by mouth daily.  90 tablet  3   No current facility-administered medications on file prior to visit.     No Known Allergies   Immunization History  Administered Date(s) Administered  . Tdap 01/13/2006       Objective:   Physical Exam  Nursing note and vitals reviewed. Constitutional: She appears well-nourished. No distress.  Cardiovascular: Normal rate, regular rhythm and normal heart sounds.   Pulmonary/Chest: Effort normal and breath sounds normal.  Neurological: She is alert.  Psychiatric: She has a normal mood and affect. Her behavior is normal. Judgment and thought content normal.      Assessment & Plan:    Martha Graham was seen today for medication management.  Diagnoses and associated orders for this visit:  Attention and concentration deficit - Discontinue: lisdexamfetamine (VYVANSE) 70 MG capsule; Take 1 capsule (70 mg total) by mouth every morning. - lisdexamfetamine (VYVANSE) 70 MG capsule; Take 1 capsule (70 mg total) by mouth every morning.  Anxiety state, unspecified - FLUoxetine (PROZAC) 40 MG  capsule; Take 1 capsule (40 mg total) by mouth daily.  Obesity, unspecified - Phentermine-Topiramate (QSYMIA) 3.75-23 MG CP24; Take 1 tablet by mouth daily. - Phentermine-Topiramate (QSYMIA) 7.5-46 MG CP24; Take 1 tablet by mouth daily.

## 2013-07-14 NOTE — Patient Instructions (Signed)
1)  ADD - Take 1/2 of the 70 mg daily.  2)  Weight - Take Qsymia daily  3)  Mood - Take Prozac daily.   Calorie Counting Diet 1200 calories  A calorie counting diet requires you to eat the number of calories that are right for you in a day. Calories are the measurement of how much energy you get from the food you eat. Eating the right amount of calories is important for staying at a healthy weight. If you eat too many calories, your body will store them as fat and you may gain weight. If you eat too few calories, you may lose weight. Counting the number of calories you eat during a day will help you know if you are eating the right amount. A Registered Dietitian can determine how many calories you need in a day. The amount of calories needed varies from person to person. If your goal is to lose weight, you will need to eat fewer calories. Losing weight can benefit you if you are overweight or have health problems such as heart disease, high blood pressure, or diabetes. If your goal is to gain weight, you will need to eat more calories. Gaining weight may be necessary if you have a certain health problem that causes your body to need more energy. TIPS Whether you are increasing or decreasing the number of calories you eat during a day, it may be hard to get used to changes in what you eat and drink. The following are tips to help you keep track of the number of calories you eat.  Measure foods at home with measuring cups. This helps you know the amount of food and number of calories you are eating.  Restaurants often serve food in amounts that are larger than 1 serving. While eating out, estimate how many servings of a food you are given. For example, a serving of cooked rice is  cup or about the size of half of a fist. Knowing serving sizes will help you be aware of how much food you are eating at restaurants.  Ask for smaller portion sizes or child-size portions at restaurants.  Plan to eat  half of a meal at a restaurant. Take the rest home or share the other half with a friend.  Read the Nutrition Facts panel on food labels for calorie content and serving size. You can find out how many servings are in a package, the size of a serving, and the number of calories each serving has.  For example, a package might contain 3 cookies. The Nutrition Facts panel on that package says that 1 serving is 1 cookie. Below that, it will say there are 3 servings in the container. The calories section of the Nutrition Facts label says there are 90 calories. This means there are 90 calories in 1 cookie (1 serving). If you eat 1 cookie you have eaten 90 calories. If you eat all 3 cookies, you have eaten 270 calories (3 servings x 90 calories = 270 calories). The list below tells you how big or small some common portion sizes are.  1 oz.........4 stacked dice.  3 oz........Marland Kitchen.Deck of cards.  1 tsp.......Marland Kitchen.Tip of little finger.  1 tbs......Marland Kitchen.Marland Kitchen.Thumb.  2 tbs.......Marland Kitchen.Golf ball.   cup......Marland Kitchen.Half of a fist.  1 cup.......Marland Kitchen.A fist. KEEP A FOOD LOG Write down every food item you eat, the amount you eat, and the number of calories in each food you eat during the day. At the end of the  day, you can add up the total number of calories you have eaten. It may help to keep a list like the one below. Find out the calorie information by reading the Nutrition Facts panel on food labels. Breakfast  Bran cereal (1 cup, 110 calories).  Fat-free milk ( cup, 45 calories). Snack  Apple (1 medium, 80 calories). Lunch  Spinach (1 cup, 20 calories).  Tomato ( medium, 20 calories).  Chicken breast strips (3 oz, 165 calories).  Shredded cheddar cheese ( cup, 110 calories).  Light [place] dressing (2 tbs, 60 calories).  Whole-wheat bread (1 slice, 80 calories).  Tub margarine (1 tsp, 35 calories).  Vegetable soup (1 cup, 160 calories). Dinner  Pork chop (3 oz, 190 calories).  Brown rice (1 cup, 215  calories).  Steamed broccoli ( cup, 20 calories).  Strawberries (1  cup, 65 calories).  Whipped cream (1 tbs, 50 calories). Daily Calorie Total: 1425 Document Released: 01/30/2005 Document Revised: 04/24/2011 Document Reviewed: 07/27/2006 [facility] Patient Information 2014 [place], [place].

## 2013-09-26 DIAGNOSIS — R41840 Attention and concentration deficit: Secondary | ICD-10-CM | POA: Insufficient documentation

## 2013-09-26 DIAGNOSIS — E669 Obesity, unspecified: Secondary | ICD-10-CM | POA: Insufficient documentation

## 2013-09-26 DIAGNOSIS — F411 Generalized anxiety disorder: Secondary | ICD-10-CM | POA: Insufficient documentation

## 2017-02-19 ENCOUNTER — Other Ambulatory Visit (HOSPITAL_COMMUNITY): Payer: Self-pay | Admitting: Family Medicine

## 2017-02-19 DIAGNOSIS — E049 Nontoxic goiter, unspecified: Secondary | ICD-10-CM

## 2017-02-22 ENCOUNTER — Ambulatory Visit (HOSPITAL_COMMUNITY)
Admission: RE | Admit: 2017-02-22 | Discharge: 2017-02-22 | Disposition: A | Discharge status: Home / Self Care | Payer: 59 | Source: Ambulatory Visit | Attending: Family Medicine | Admitting: Family Medicine

## 2017-02-22 DIAGNOSIS — E049 Nontoxic goiter, unspecified: Secondary | ICD-10-CM

## 2017-02-22 DIAGNOSIS — E042 Nontoxic multinodular goiter: Secondary | ICD-10-CM | POA: Insufficient documentation

## 2017-03-09 ENCOUNTER — Other Ambulatory Visit: Payer: Self-pay | Admitting: Family Medicine

## 2017-03-09 DIAGNOSIS — E041 Nontoxic single thyroid nodule: Secondary | ICD-10-CM

## 2017-03-29 ENCOUNTER — Other Ambulatory Visit (HOSPITAL_COMMUNITY)
Admission: RE | Admit: 2017-03-29 | Discharge: 2017-03-29 | Disposition: A | Discharge status: Home / Self Care | Payer: 59 | Source: Ambulatory Visit | Attending: Physician Assistant | Admitting: Physician Assistant

## 2017-03-29 ENCOUNTER — Ambulatory Visit
Admission: RE | Admit: 2017-03-29 | Discharge: 2017-03-29 | Disposition: A | Discharge status: Home / Self Care | Payer: 59 | Source: Ambulatory Visit | Attending: Family Medicine | Admitting: Family Medicine

## 2017-03-29 DIAGNOSIS — E041 Nontoxic single thyroid nodule: Secondary | ICD-10-CM | POA: Insufficient documentation

## 2017-03-29 NOTE — Procedures (Signed)
PROCEDURE SUMMARY:  Using direct ultrasound guidance, 4 passes were made using 25 g needles into the nodule within the isthmus of the thyroid.   Ultrasound was used to confirm needle placements on all occasions.   Specimens were sent to Pathology for analysis.  See procedure note under Imaging tab in Epic for full procedure details.  WENDY S BLAIR PA-C 03/29/2017 9:16 AM

## 2018-08-02 IMAGING — US US THYROID
1 series · 13 of 25 positions shown · non-contrast
Comparison: 07/21/2002 report only

CLINICAL DATA: Goiter. Prior ultrasound follow-up. Left parathyroid
removal. Enlarged thyroid.

EXAM:
THYROID ULTRASOUND
TECHNIQUE: Ultrasound examination of the thyroid gland and adjacent soft
tissues was performed.

[Series 1: us thyroid · 13 of 41 slices shown]
[im 1/41]
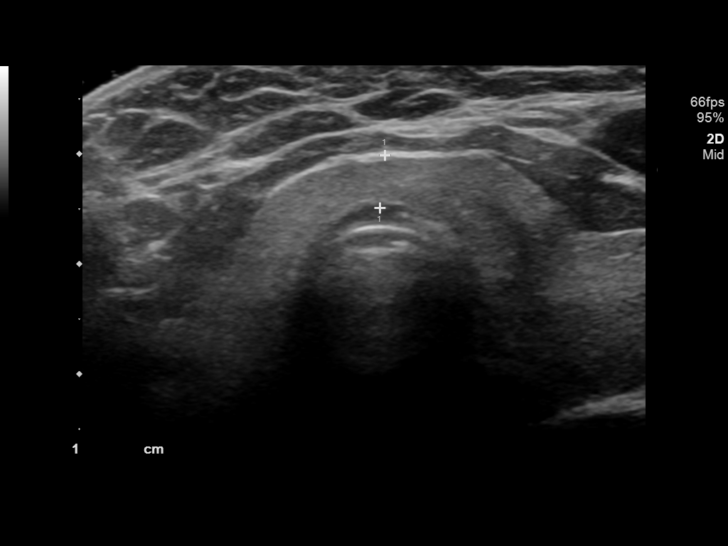
[im 4/41]
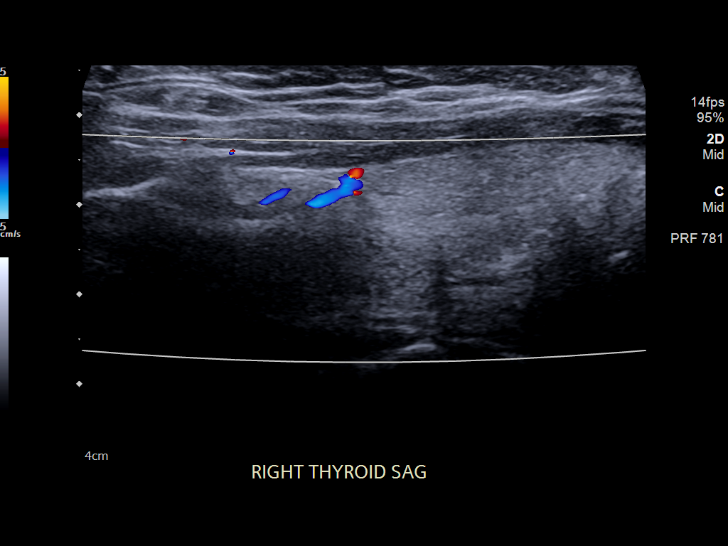
[im 7/41]
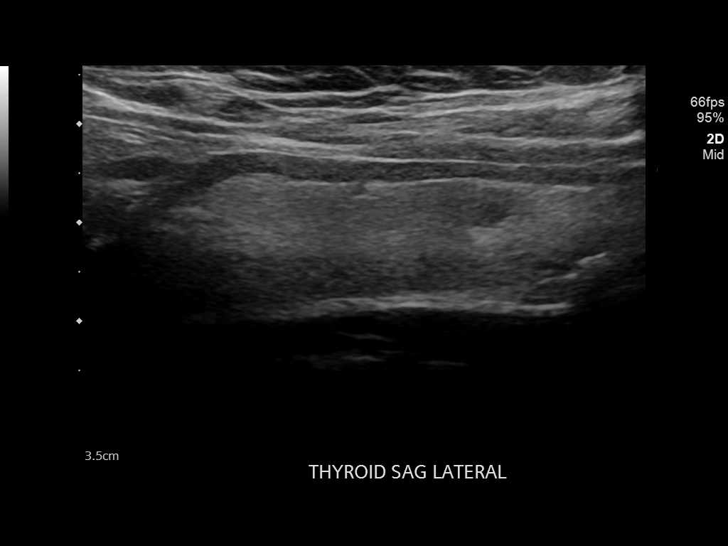
[im 11/41]
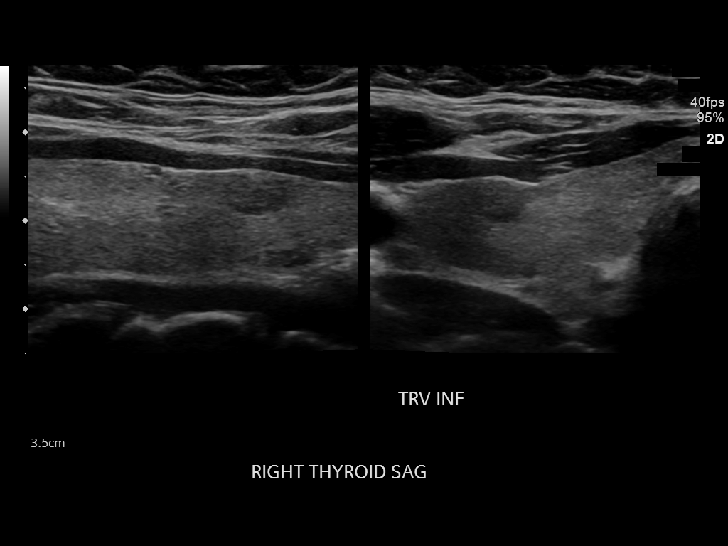
[im 14/41]
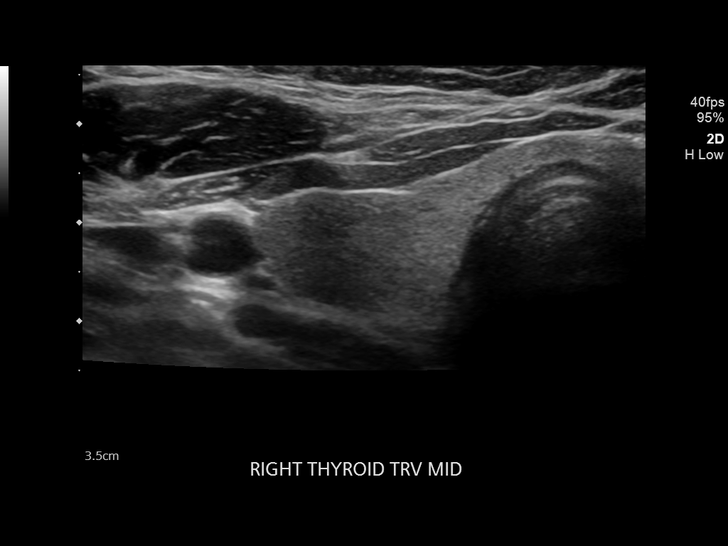
[im 17/41]
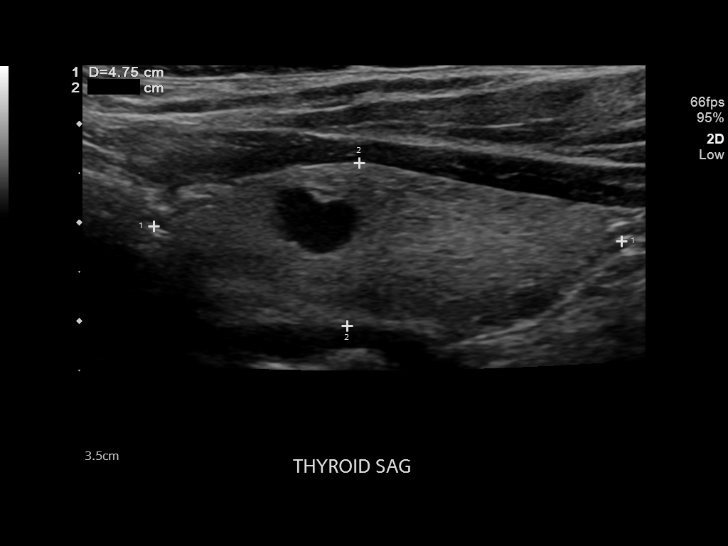
[im 21/41]
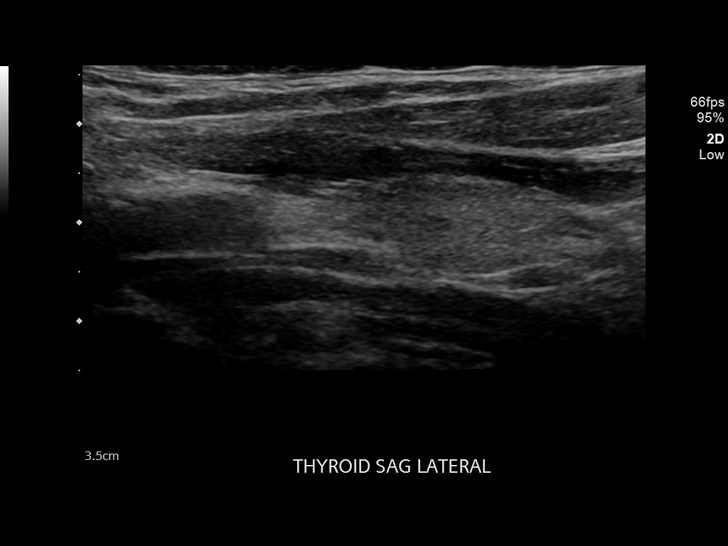
[im 24/41]
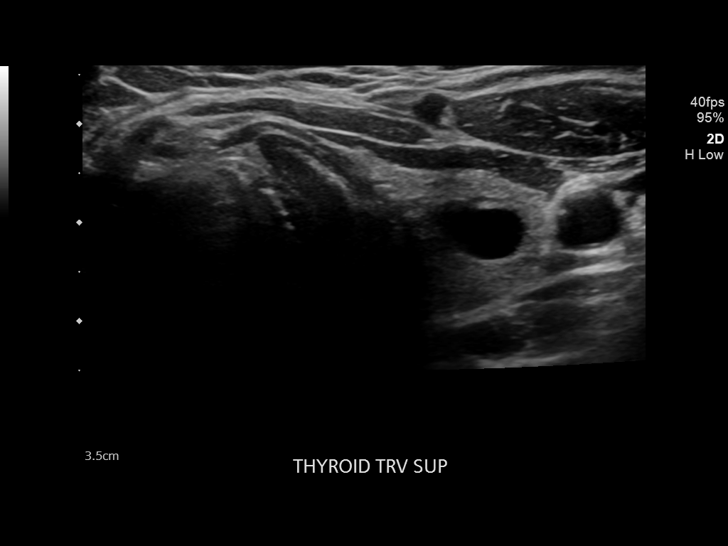
[im 27/41]
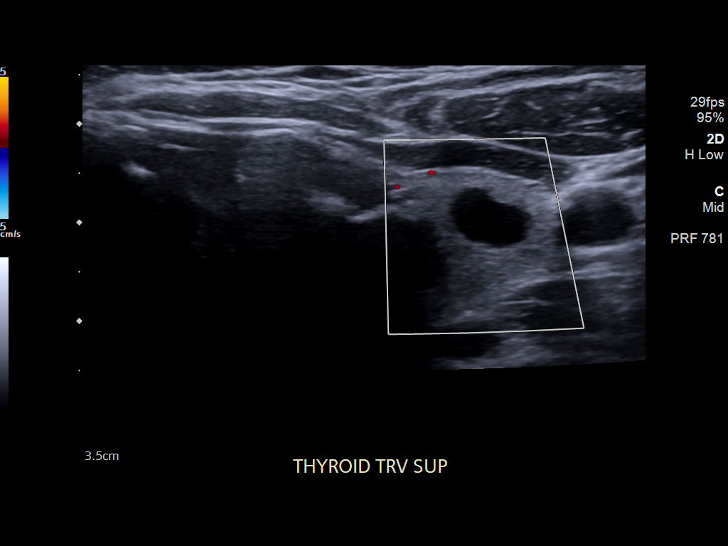
[im 31/41]
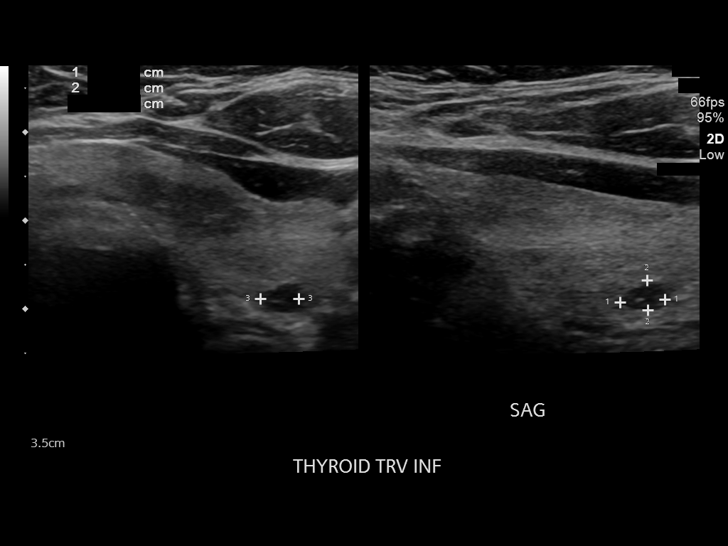
[im 34/41]
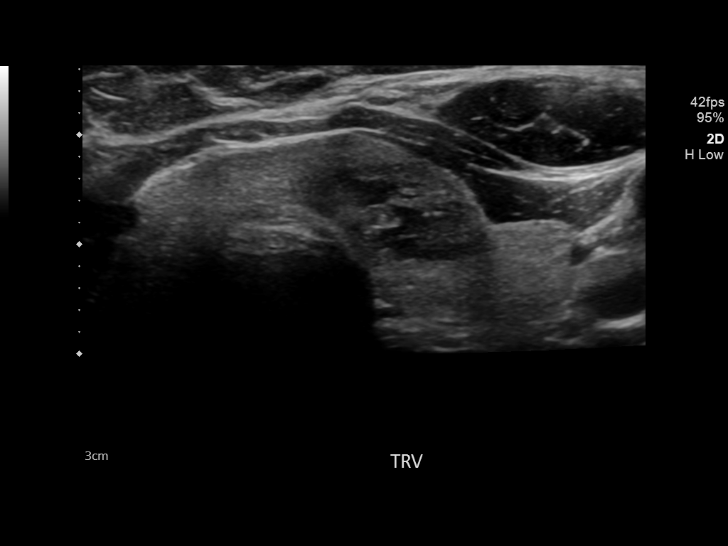
[im 37/41]
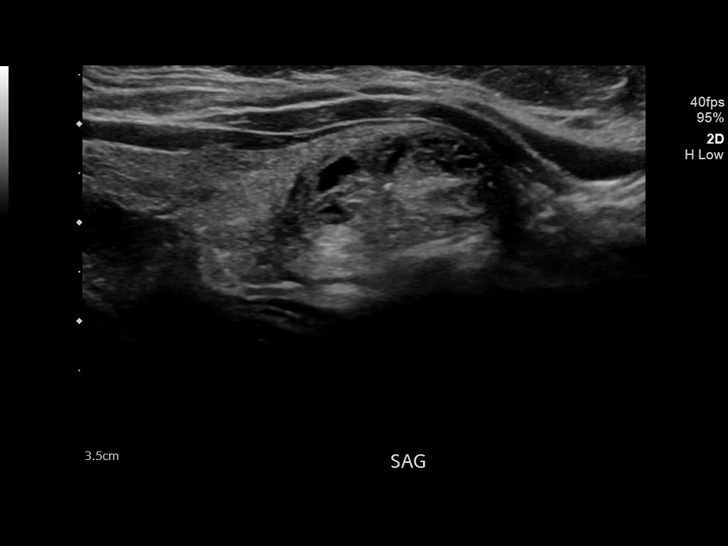
[im 41/41]
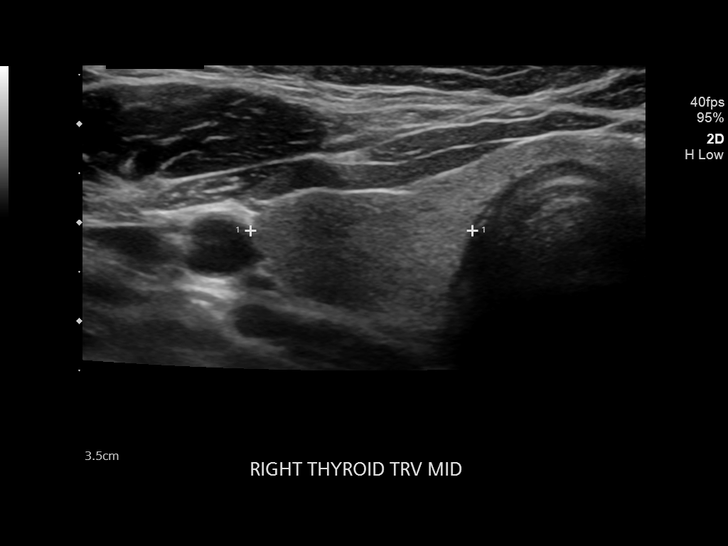

[13 of 25 positions shown; findings below may reference images not displayed]

FINDINGS: Parenchymal Echotexture: Normal

Isthmus: 0.5 cm, previously 0.4 cm

Right lobe: 5.6 x 1.5 x 2.3 cm, previously 6.6 x 1.6 x 1.9 cm

Left lobe: 4.8 x 1.7 x 1.6 cm, previously 4.9 x 1.3 x 1.8 cm

_________________________________________________________

Estimated total number of nodules >/= 1 cm: 1

Number of spongiform nodules >/=  2 cm not described below (TR1): 0

Number of mixed cystic and solid nodules >/= 1.5 cm not described
below (TR2): 0

_________________________________________________________

Nodule # 1:

Location: Isthmus; Mid

Maximum size: 2.5 cm; Other 2 dimensions: 1.3 x 1.1 cm

Composition: solid/almost completely solid (2)

Echogenicity: isoechoic (1)

Shape: not taller-than-wide (0)

Margins: smooth (0)

Echogenic foci: none (0)

ACR TI-RADS total points: 3.

ACR TI-RADS risk category: TR3 (3 points).

ACR TI-RADS recommendations: **Given size (>/= 2.5 cm) and
appearance, fine needle aspiration of this mildly suspicious nodule
should be considered based on TI-RADS criteria.

_________________________________________________________

Right lower pole 0.7 cm hypoechoic nodule does not meet criteria for
biopsy nor follow-up.

Small benign cysts in the left lobe do not meet criteria for biopsy
nor follow-up.
IMPRESSION: New nodule 1 in the isthmus meets criteria for fine needle
aspiration biopsy.

The above is in keeping with the ACR TI-RADS recommendations - [HOSPITAL] 1630;[DATE].

## 2018-11-27 IMAGING — US US FNA BIOPSY THYROID 1ST LESION
1 series · 13 of 13 positions shown · non-contrast
Comparison: Ultrasound done 02/22/2017

MEDICATIONS:
1% Lidocaine = 4 mL

COMPLICATIONS:
None immediate.

INDICATION: Indeterminate thyroid nodule

EXAM:
ULTRASOUND GUIDED FINE NEEDLE ASPIRATION OF INDETERMINATE THYROID
NODULE
TECHNIQUE: Informed written consent was obtained from the patient after a
discussion of the risks, benefits and alternatives to treatment.
Questions regarding the procedure were encouraged and answered. A
timeout was performed prior to the initiation of the procedure.

[Series 1: us fna biopsy thyroid 1st lesion · 0.06mm/px · 13 acquisitions, 13 frames shown]
[im 1/13]
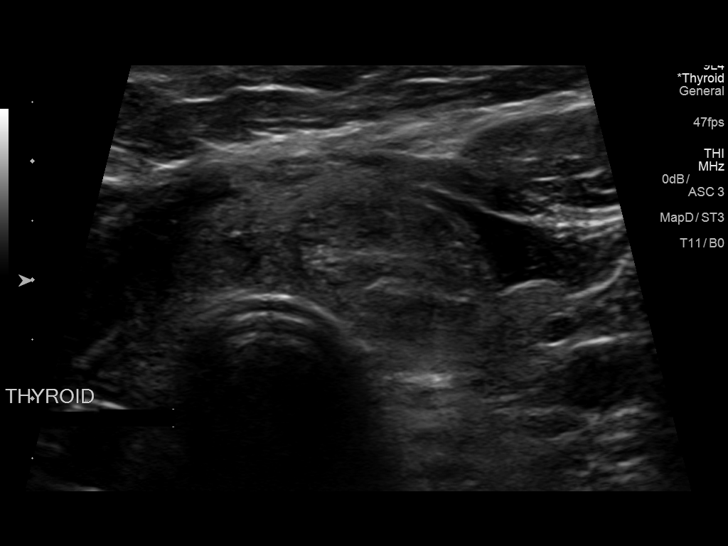
[im 2/13]
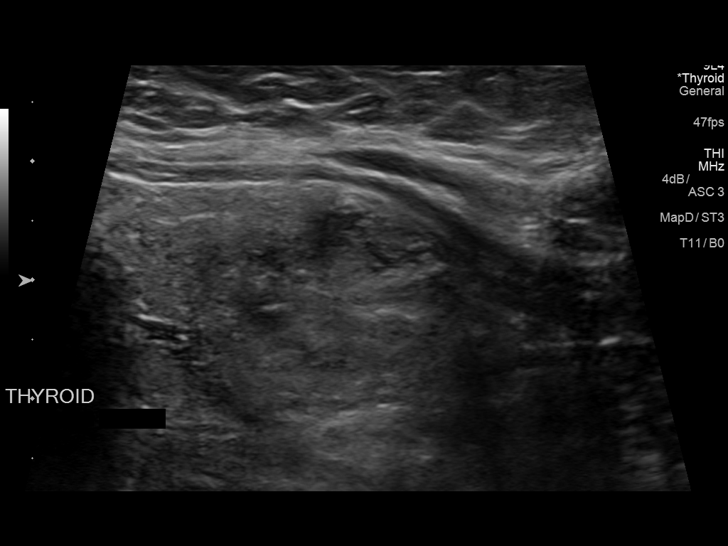
[im 3/13]
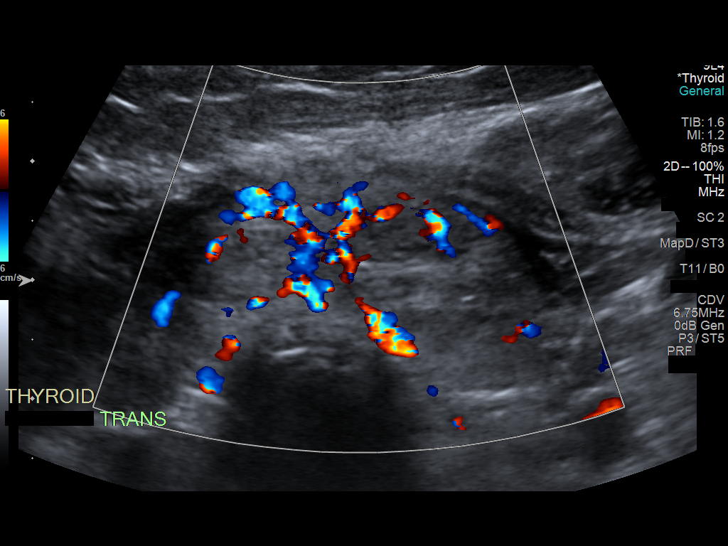
[im 4/13]
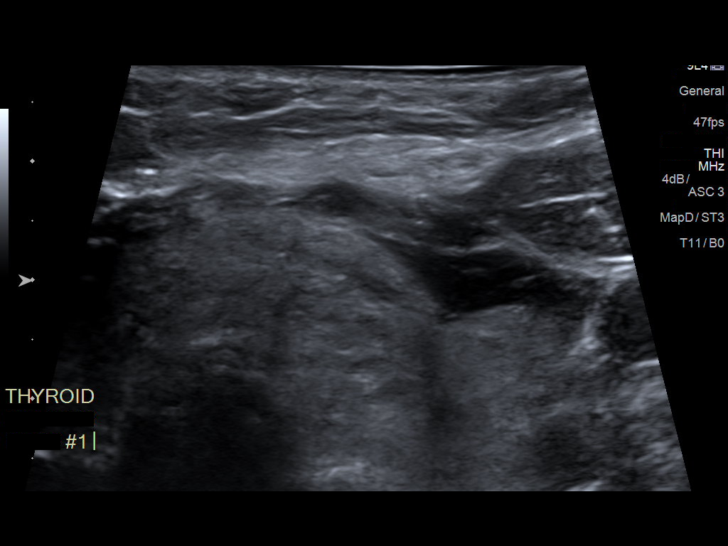
[im 5/13]
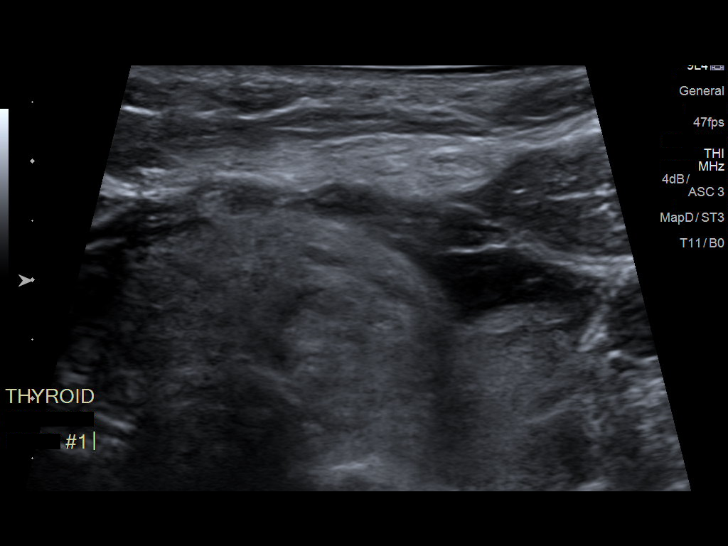
[im 6/13]
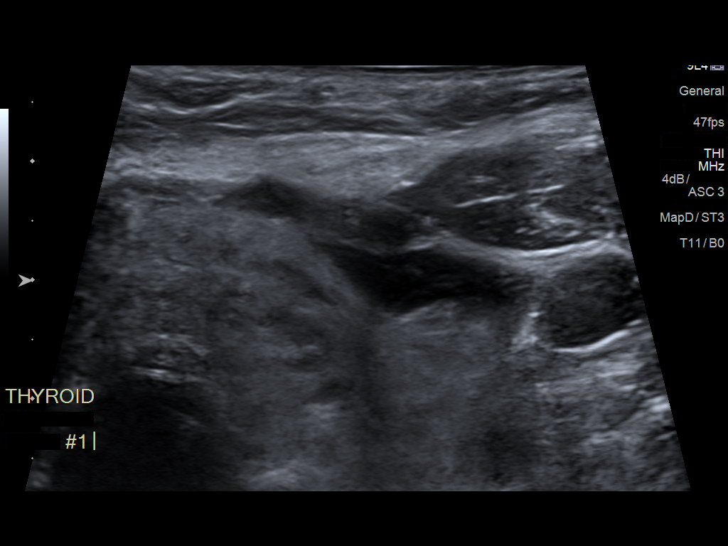
[im 7/13]
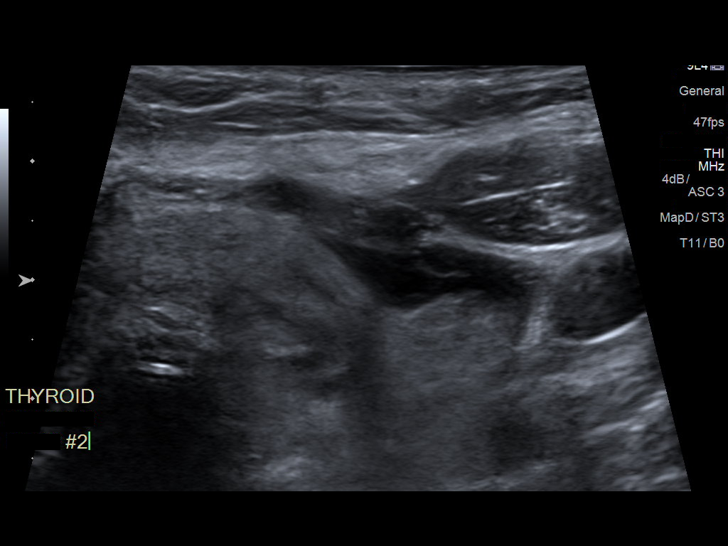
[im 8/13]
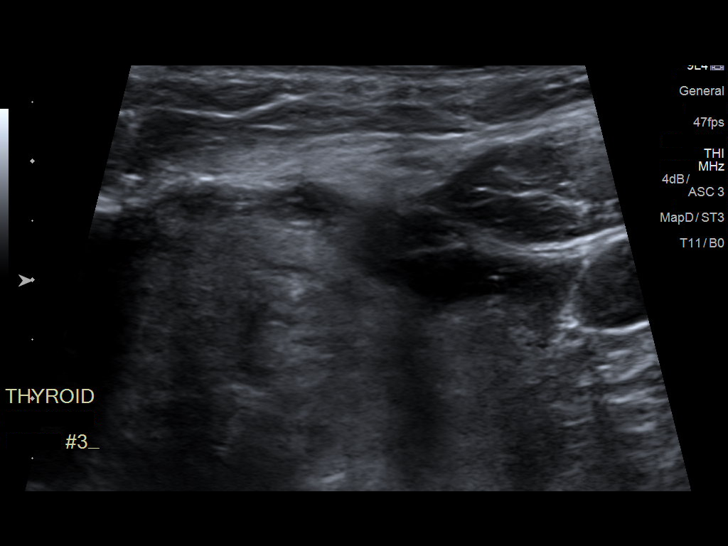
[im 9/13]
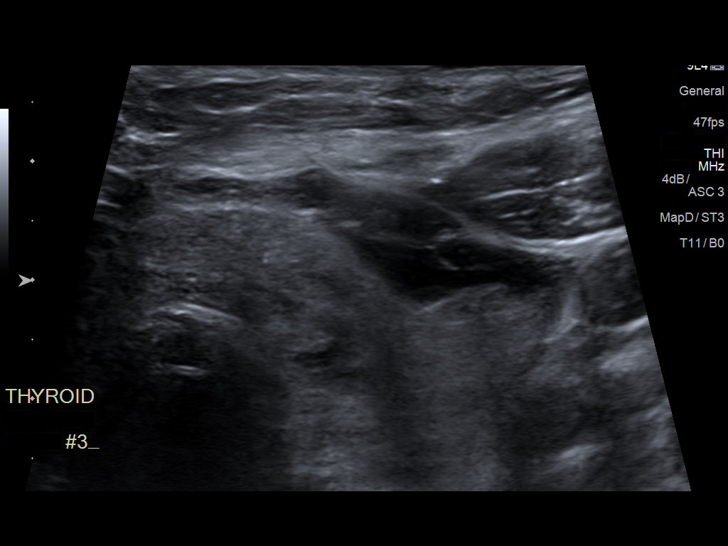
[im 10/13]
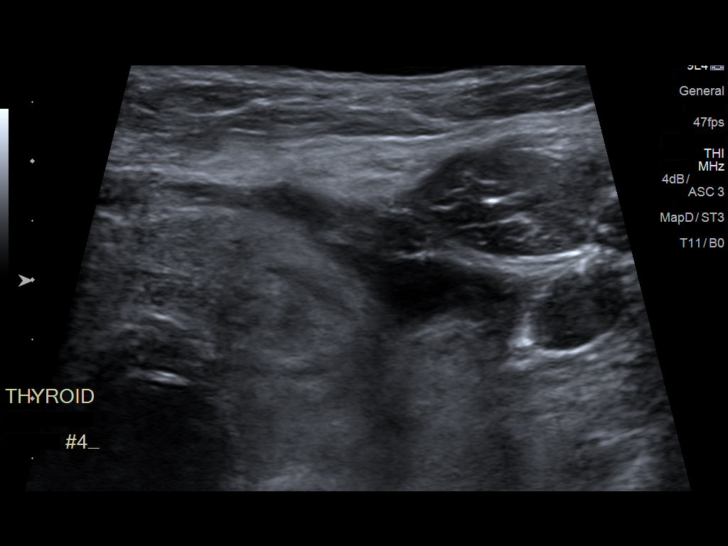
[im 11/13]
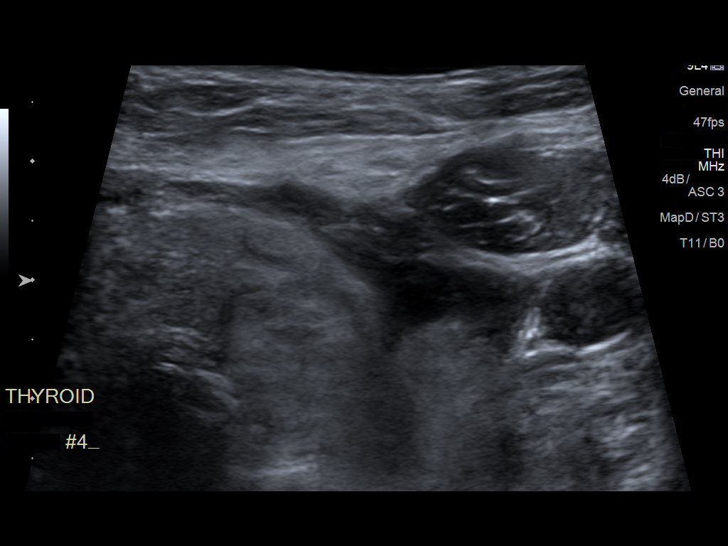
[im 12/13]
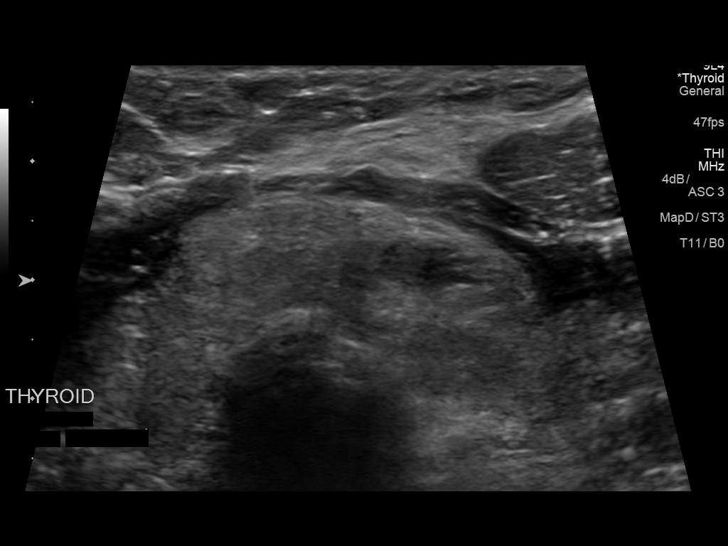
[im 13/13]
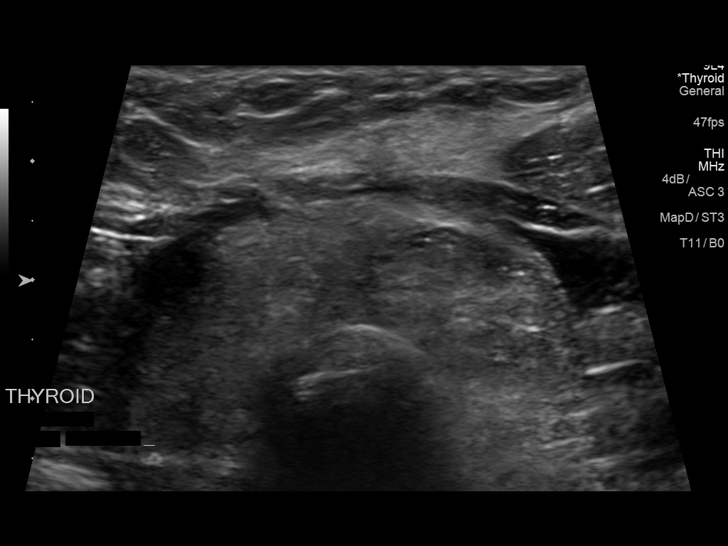

[13 of 13 positions shown; findings below may reference images not displayed]

Pre-procedural ultrasound scanning demonstrated unchanged size and
appearance of the indeterminate nodule within the isthmus of the
thyroid.

The procedure was planned. The neck was prepped in the usual sterile
fashion, and a sterile drape was applied covering the operative
field. A timeout was performed prior to the initiation of the
procedure. Local anesthesia was provided with 1% lidocaine.

Under direct ultrasound guidance, 4 FNA biopsies were performed of
the isthmus nodule with a 25 gauge needle. Multiple ultrasound
images were saved for procedural documentation purposes. The samples
were prepared and submitted to pathology.

Limited post procedural scanning was negative for hematoma or
additional complication. Dressings were placed. The patient
tolerated the above procedures procedure well without immediate
postprocedural complication.
FINDINGS: Nodule reference number based on prior diagnostic ultrasound: 1

Maximum size: 2.5 cm

Location: [place]; Mid

ACR TI-RADS risk category: TR3 (3 points)

Reason for biopsy: meets ACR TI-RADS criteria

Ultrasound imaging confirms appropriate placement of the needles
within the thyroid nodule.
IMPRESSION: Technically successful ultrasound guided fine needle aspiration of
the nodule within the isthmus of the thyroid.
